# Patient Record
Sex: Male | Born: 2000 | Race: White | Hispanic: No | Marital: Single | Smoking: Never smoker
Health system: Southern US, Community
[De-identification: ages and names within clinical notes are randomized; demographics above are authoritative.]

## PROBLEM LIST (undated history)

## (undated) DIAGNOSIS — F909 Attention-deficit hyperactivity disorder, unspecified type: Secondary | ICD-10-CM

## (undated) HISTORY — PX: NO PAST SURGERIES: SHX2092

## (undated) HISTORY — DX: Attention-deficit hyperactivity disorder, unspecified type: F90.9

---
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
## 2021-04-16 ENCOUNTER — Ambulatory Visit (INDEPENDENT_AMBULATORY_CARE_PROVIDER_SITE_OTHER): Payer: Managed Care, Other (non HMO) | Admitting: Family Medicine

## 2021-04-16 ENCOUNTER — Encounter: Payer: Self-pay | Admitting: Family Medicine

## 2021-04-16 ENCOUNTER — Other Ambulatory Visit: Payer: Self-pay

## 2021-04-16 VITALS — BP 110/70 | HR 77 | Temp 98.4°F | Ht 69.5 in | Wt 154.4 lb

## 2021-04-16 DIAGNOSIS — Z0000 Encounter for general adult medical examination without abnormal findings: Secondary | ICD-10-CM | POA: Diagnosis not present

## 2021-04-16 DIAGNOSIS — Z23 Encounter for immunization: Secondary | ICD-10-CM

## 2021-04-16 DIAGNOSIS — Z111 Encounter for screening for respiratory tuberculosis: Secondary | ICD-10-CM

## 2021-04-16 NOTE — Progress Notes (Signed)
Chief Complaint  Patient presents with   Annual Exam    Well Male Manuel Mcneil is here for a complete physical.  Here w mom.  His last physical was >1 year ago.  Current diet: in general, diet could be better.   Current exercise: none Weight trend: stable Fatigue out of ordinary? No. Seat belt? Yes.    Health maintenance Tetanus- Yes HIV- No Hep C- No  Uncle passed away from cardiac arrest at 43. Had PD and recent implant in brain. +Concussion in 11/21, seems to have resolved.  2+  Past Medical History:  Diagnosis Date   ADHD      Past Surgical History:  Procedure Laterality Date   NO PAST SURGERIES      Medications  Takes no meds routinely.     Allergies Allergies  Allergen Reactions   Penicillins     Family History Family History  Problem Relation Age of Onset   Hypertension Father    Depression Sister    Arthritis Maternal Grandmother    Early death Maternal Grandfather    Arthritis Paternal Grandmother    Cancer Paternal Grandmother        breast cancer    Review of Systems: Constitutional: no fevers or chills Eye:  Poor vision in R eye, L eye WNL Ear/Nose/Mouth/Throat:  Ears:  no hearing loss Nose/Mouth/Throat:  no complaints of nasal congestion, no sore throat Cardiovascular:  no chest pain Respiratory:  no shortness of breath Gastrointestinal:  no abdominal pain, no change in bowel habits GU:  Male: negative for dysuria Musculoskeletal/Extremities:  no pain of the joints Integumentary (Skin/Breast):  no abnormal skin lesions reported Neurologic:  no headaches Endocrine: No unexpected weight changes Hematologic/Lymphatic:  no night sweats  Exam BP 110/70   Pulse 77   Temp 98.4 F (36.9 C) (Oral)   Ht 5' 9.5" (1.765 m)   Wt 154 lb 6 oz (70 kg)   SpO2 97%   BMI 22.47 kg/m  General:  well developed, well nourished, in no apparent distress Skin:  no significant moles, warts, or growths Head:  no masses, lesions, or tenderness Eyes:   pupils equal and round, sclera anicteric without injection Ears:  canals without lesions, TMs shiny without retraction, no obvious effusion, no erythema Nose:  nares patent, septum midline, mucosa normal Throat/Pharynx:  lips and gingiva without lesion; tongue and uvula midline; non-inflamed pharynx; no exudates or postnasal drainage Neck: neck supple without adenopathy, thyromegaly, or masses Lungs:  clear to auscultation, breath sounds equal bilaterally, no respiratory distress Cardio:  regular rate and rhythm, no bruits, no LE edema Abdomen:  abdomen soft, nontender; bowel sounds normal; no masses or organomegaly Genital (male): Deferred Rectal: Deferred Musculoskeletal:  symmetrical muscle groups noted without atrophy or deformity Extremities:  no clubbing, cyanosis, or edema, no deformities, no skin discoloration Neuro:  gait normal; deep tendon reflexes normal and symmetric Psych: well oriented with normal range of affect and appropriate judgment/insight  Assessment and Plan  Well adult exam  Need for meningitis vaccination - Plan: Meningococcal B, OMV  Screening-pulmonary TB - Plan: PPD   Well 20 y.o. male. Counseled on diet and exercise. Self testicular exams recommended at least monthly.  Declines labs.  1st MenB today. 2nd in 1 mo. TST today, reck Fri afternoon.  Declined HPV vaccine today.  Flu shot rec'd for Oct.  Other orders as above. Follow up in 1 year pending the above workup. The patient voiced understanding and agreement to the plan.  Jilda Roche [place], DO 04/16/21 4:06 PM

## 2021-04-16 NOTE — Patient Instructions (Signed)
Keep the diet clean and stay active.  Do monthly self testicular checks in the shower. You are feeling for lumps/bumps that don't belong. If you feel anything like this, let me know!  I recommend getting the flu shot in mid October. This suggestion would change if the CDC comes out with a different recommendation.   Let us know if you need anything.

## 2021-04-18 LAB — TB SKIN TEST
Induration: 0 mm
TB Skin Test: NEGATIVE

## 2022-11-27 ENCOUNTER — Other Ambulatory Visit: Payer: PRIVATE HEALTH INSURANCE

## 2022-11-27 ENCOUNTER — Other Ambulatory Visit: Payer: Self-pay | Admitting: Family Medicine

## 2022-11-27 DIAGNOSIS — K429 Umbilical hernia without obstruction or gangrene: Secondary | ICD-10-CM

## 2022-12-25 ENCOUNTER — Other Ambulatory Visit: Payer: Managed Care, Other (non HMO)

## 2023-01-20 ENCOUNTER — Ambulatory Visit (INDEPENDENT_AMBULATORY_CARE_PROVIDER_SITE_OTHER): Payer: No Typology Code available for payment source | Admitting: Family Medicine

## 2023-01-20 ENCOUNTER — Encounter: Payer: Self-pay | Admitting: Family Medicine

## 2023-01-20 VITALS — BP 111/69 | HR 68 | Temp 98.0°F | Ht 70.0 in | Wt 165.2 lb

## 2023-01-20 DIAGNOSIS — R109 Unspecified abdominal pain: Secondary | ICD-10-CM | POA: Diagnosis not present

## 2023-01-20 DIAGNOSIS — M79605 Pain in left leg: Secondary | ICD-10-CM

## 2023-01-20 DIAGNOSIS — M79604 Pain in right leg: Secondary | ICD-10-CM | POA: Diagnosis not present

## 2023-01-20 NOTE — Progress Notes (Signed)
Musculoskeletal Exam  Patient: Manuel Mcneil DOB: 14-Jan-2001  DOS: 01/20/2023  SUBJECTIVE:  Chief Complaint:   Chief Complaint  Patient presents with   sports injury    Manuel Mcneil is a 22 y.o.  male for evaluation and treatment of abd pain.  He is here with his mother.  Onset:  2 months ago.  He was struck in the stomach with a lacrosse stick. Location: Central abdominal region between the rectus muscles. Character:  aching  Progression of issue:  has improved Associated symptoms: bulge when he bears down and pain w exercise.  Treatment: to date has been rest and ice.   Neurovascular symptoms: no  Past Medical History:  Diagnosis Date   ADHD     Objective: VITAL SIGNS: BP 111/69 (BP Location: Left Arm, Patient Position: Sitting, Cuff Size: Normal)   Pulse 68   Temp 98 F (36.7 C) (Oral)   Ht 5\' 10"  (1.778 m)   Wt 165 lb 4 oz (75 kg)   SpO2 97%   BMI 23.71 kg/m  Constitutional: Well formed, well developed. No acute distress. Thorax & Lungs: No accessory muscle use Musculoskeletal: Abd wall pain, lower extremities.   Normal active range of motion: yes.   Normal passive range of motion: yes Tenderness to palpation: Yes over the linea alba in addition to the tibialis anterior bilaterally, calves bilaterally, and lateral compartment bilaterally Deformity: no Ecchymosis: no Tests positive: None Tests negative: Squeeze test There is no swelling. Neurologic: Normal sensory function. No focal deficits noted.  Psychiatric: Normal mood. Age appropriate judgment and insight. Alert & oriented x 3.    Assessment:  Pain in both lower extremities  Abdominal wall pain - Plan: Ambulatory referral to Sports Medicine  Plan: Vitamin D, stretches/exercises, heat, ice, Tylenol.  PT if no better. Refer to sports medicine.  Suspect linea alba injury.  May need physical therapy as well. F/u as originally scheduled. The patient and his mother voiced understanding and agreement  to the plan.   Jilda Roche [place], DO 01/20/23  12:25 PM

## 2023-01-20 NOTE — Patient Instructions (Addendum)
Heat (pad or rice pillow in microwave) over affected area, 10-15 minutes twice daily.   Ice/cold pack over area for 10-15 min twice daily.  OK to take Tylenol 1000 mg (2 extra strength tabs) or 975 mg (3 regular strength tabs) every 6 hours as needed.  Use a tennis ball over the outside and front of your legs a few times a day.   OK to use Debrox (peroxide) in the ear to loosen up wax. Also recommend using a bulb syringe (for removing boogers from baby's noses) to flush through warm water and vinegar (3-4:1 ratio). An alternative, though more expensive, is an elephant ear washer wax removal kit. Do not use Q-tips as this can impact wax further.  Let us know if you need anything.  Stretching and range of motion exercises These exercises warm up your muscles and joints and improve the movement and flexibility of your lower leg. These exercises also help to relieve pain and stiffness.  Exercise A: Gastrocnemius stretch Sit with your left / right leg extended. Loop a belt or towel around the ball of your left / right foot. The ball of your foot is on the walking surface, right under your toes. Hold both ends of the belt or towel. Keep your left / right ankle and foot relaxed and keep your knee straight while you use the belt or towel to pull your foot and ankle toward you. Stop at the first point of resistance. Hold this position for 30 seconds. Repeat 2 times. Complete this exercise 3 times per week.  Exercise B: Ankle alphabet Sit with your left / right leg supported at the lower leg. Do not rest your foot on anything. Make sure your foot has room to move freely. Think of your left / right foot as a paintbrush, and move your foot to trace each letter of the alphabet in the air. Keep your hip and knee still while you trace. Trace every letter from A to Z. Repeat 2 times. Complete this exercise 3 times per week.  Strengthening exercises These exercises build strength and endurance in your  lower leg. Endurance is the ability to use your muscles for a long time, even after they get tired.  Exercise C: Plantar flexors with band Sit with your left / right leg extended. Loop a rubber exercise band or tube around the ball of your left / right foot. The ball of your foot is on the walking surface, right under your toes. While holding both ends of the band or tube, slowly point your toes downward, pushing them away from you. Hold this position for 3 seconds. Slowly return your foot to the starting position and repeat for a total of 10 repetitions. Repeat 2 times. Complete this exercise 3 times per week.  Exercise D: Plantar flexors, standing Stand with your feet shoulder-width apart. Place your hands on a wall or table to steady yourself as needed, but try not to use it very much for support. Rise up on your toes. If this exercise is too easy, try these options: Shift your weight toward your left / right leg until you feel challenged. If told by your health care provider, stand on your left / right foot only. Hold this position for 3 seconds. Repeat for a total of 10 repetitions. Repeat 2 times. Complete this exercise 3 times per week.  Exercise E: Plantar flexors, eccentric Stand on the balls of your feet on the edge of a step. The ball of your  foot is on the walking surface, right under your toes. Place your hands on a wall or railing for balance as needed, but try not to lean on it for support. Rise up on your toes, using both legs to help. Slowly shift all of your weight to your left / right foot and lift your other foot off the step. Slowly lower your left / right heel so it drops below the level of the step. You will feel a slight stretch in your left / right calf. Put your other foot back onto the step. Repeat 2 times. Complete this exercise 3 times per week. This information is not intended to replace advice given to you by your health care provider. Make sure you discuss  any questions you have with your health care provider.

## 2023-01-26 ENCOUNTER — Ambulatory Visit (INDEPENDENT_AMBULATORY_CARE_PROVIDER_SITE_OTHER): Payer: No Typology Code available for payment source | Admitting: Sports Medicine

## 2023-01-26 VITALS — BP 124/82 | Ht 70.0 in | Wt 165.0 lb

## 2023-01-26 DIAGNOSIS — M722 Plantar fascial fibromatosis: Secondary | ICD-10-CM | POA: Diagnosis not present

## 2023-01-26 DIAGNOSIS — S301XXA Contusion of abdominal wall, initial encounter: Secondary | ICD-10-CM | POA: Diagnosis not present

## 2023-01-26 NOTE — Progress Notes (Signed)
   Subjective:    Patient ID: Traveon Eady, male    DOB: 2000-12-06, 22 y.o.   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  He noticed some bulging around his umbilicus shortly after the injury.  He was evaluated at [facility].  He was able to complete the lacrosse season.  His symptoms are improving but have not resolved.  He still notices that the umbilicus protrudes more than it used to.  Pain is intermittent.  He was also diagnosed with Planter fasciitis during lacrosse season.    Review of Systems As above    Objective:   Physical Exam  Well-developed, fit appearing.  No acute distress  Exam of the abdomen does show some bulging around the umbilicus.  This is not made worse with Valsalva.  There is no palpable reducible hernia.  No ecchymosis.  Bedside ultrasound shows some hypoechoic changes deep to the umbilicus which may suggest a small resolving hematoma in this area.  This is inconclusive as the umbilicus itself does produce some anisotropy.      Assessment & Plan:   Abdominal contusion versus small hematoma  Reassurance that his symptoms should continue to resolve.  I think he may continue with activity using pain as his guide.  I would like to get the records from [facility].  It sounds like he had some imaging done and I would like to review that. I will message him through MyChart once I reviewed those records.  In regards to his Planter fasciitis, I recommended that he return to the office soon with his lacrosse cleats.  We can fit them with either some green sports insoles or some gel cups.  This note was dictated using Dragon naturally speaking software and may contain errors in syntax, spelling, or content which  have not been identified prior to signing this note.

## 2023-01-28 ENCOUNTER — Encounter: Payer: Self-pay | Admitting: Sports Medicine

## 2023-06-08 ENCOUNTER — Encounter: Payer: Self-pay | Admitting: Family Medicine

## 2023-06-08 ENCOUNTER — Ambulatory Visit: Payer: No Typology Code available for payment source | Admitting: Family Medicine

## 2023-06-08 ENCOUNTER — Telehealth: Payer: Self-pay | Admitting: Family Medicine

## 2023-06-08 VITALS — BP 134/71 | HR 72 | Temp 98.1°F | Resp 16 | Wt 162.4 lb

## 2023-06-08 DIAGNOSIS — F411 Generalized anxiety disorder: Secondary | ICD-10-CM

## 2023-06-08 DIAGNOSIS — F410 Panic disorder [episodic paroxysmal anxiety] without agoraphobia: Secondary | ICD-10-CM | POA: Diagnosis not present

## 2023-06-08 MED ORDER — CITALOPRAM HYDROBROMIDE 10 MG PO TABS: 10.0000 mg | ORAL_TABLET | Freq: Every day | ORAL | 3 refills | Status: DC

## 2023-06-08 MED ORDER — CITALOPRAM HYDROBROMIDE 10 MG PO TABS: 10.0000 mg | ORAL_TABLET | Freq: Every day | ORAL | 3 refills | Status: AC

## 2023-06-08 MED ORDER — PROPRANOLOL HCL 10 MG PO TABS: ORAL_TABLET | ORAL | 1 refills | Status: AC

## 2023-06-08 MED ORDER — PROPRANOLOL HCL 10 MG PO TABS: ORAL_TABLET | ORAL | 1 refills | Status: DC

## 2023-06-08 NOTE — Addendum Note (Signed)
Addended by: Scharlene Gloss B on: 06/08/2023 04:45 PM   Modules accepted: Orders

## 2023-06-08 NOTE — Telephone Encounter (Signed)
Victorino Dike (Emergency Contact) called stating pt needed to have their medications transferred to the following pharmacy as [facility] does not accept their insurance:  [facility] [address] [place], [place] [address] P: [phone]

## 2023-06-08 NOTE — Patient Instructions (Signed)
Coping skills Choose 5 that work for you: Take a deep breath Count to 20 Read a book Do a puzzle Meditate Bake Sing Knit Garden Pray Go outside Call a friend Listen to music Take a walk Color Send a note Take a bath Watch a movie Be alone in a quiet place Pet an animal Visit a friend Journal Exercise Stretch   Please consider counseling. Contact [phone] to schedule an appointment or inquire about cost/insurance coverage.  [facility] located at [address], [address], [place], [place].  Phone number = [phone].  Dr. Regan Lemming - Adult Psychiatry.    [facility] located at [address] [place], [place], [place]. Phone number = [phone].   [facility] located at [address], [place], [place].  Phone number = [phone].   [facility] located at [address] [place], [place], [place].  Phone number = [phone].  Stay active.  Let us know if you need anything.

## 2023-06-08 NOTE — Progress Notes (Signed)
Chief Complaint  Patient presents with   Panic Attack    History of panic attacks on and off for years. Daily last week.     Subjective Manuel Mcneil presents for f/u anxiety/panic attacks. Mom present also.   Pt is no currently on any medications.  Has had this off/on for the past several years. Over the past month, has had them more frequently. Having a single panic attack has raised his overall anxiety causing more panic attacks.  No thoughts of harming self or others. No self-medication with alcohol, prescription drugs or illicit drugs. Pt is not following with a counselor/psychologist.  Past Medical History:  Diagnosis Date   ADHD    Allergies as of 06/08/2023       Reactions   Penicillins         Medication List        Accurate as of June 08, 2023 12:22 PM. If you have any questions, ask your nurse or doctor.          citalopram 10 MG tablet Commonly known as: CELEXA Take 1 tablet (10 mg total) by mouth daily. Started by: Sharlene Dory   propranolol 10 MG tablet Commonly known as: INDERAL Take 1 tab 3 times daily as needed for anxiety. Started by: Sharlene Dory        Exam BP 134/71 (BP Location: Right Arm, Patient Position: Sitting, Cuff Size: Small)   Pulse 72   Temp 98.1 F (36.7 C) (Oral)   Resp 16   Wt 162 lb 6.4 oz (73.7 kg)   SpO2 99%   BMI 23.30 kg/m  General:  well developed, well nourished, in no apparent distress Lungs:  No respiratory distress Psych: well oriented with normal range of affect and age-appropriate judgement/insight, alert and oriented x4.  Assessment and Plan  GAD (generalized anxiety disorder) - Plan: citalopram (CELEXA) 10 MG tablet  Panic attack - Plan: citalopram (CELEXA) 10 MG tablet, propranolol (INDERAL) 10 MG tablet  Chronic, uncontrolled. Counseling info provided. Anxiety coping techniques provided. Propranolol 10 mg TID prn anxiety, Celexa 10 mg/d.  F/u in 6 weeks. The patient and  his mom voiced understanding and agreement to the plan.  Jilda Roche [place], DO 06/08/23 12:22 PM

## 2023-06-08 NOTE — Telephone Encounter (Signed)
Sent in prescriptions to updated pharmacy

## 2023-07-20 ENCOUNTER — Ambulatory Visit: Payer: No Typology Code available for payment source | Admitting: Family Medicine

## 2023-10-29 ENCOUNTER — Ambulatory Visit (INDEPENDENT_AMBULATORY_CARE_PROVIDER_SITE_OTHER): Payer: No Typology Code available for payment source | Admitting: Sports Medicine

## 2023-10-29 ENCOUNTER — Other Ambulatory Visit: Payer: Self-pay

## 2023-10-29 VITALS — BP 116/82 | Ht 70.0 in | Wt 165.0 lb

## 2023-10-29 DIAGNOSIS — M25512 Pain in left shoulder: Secondary | ICD-10-CM | POA: Diagnosis not present

## 2023-10-29 MED ORDER — NITROGLYCERIN 0.2 MG/HR TD PT24: MEDICATED_PATCH | TRANSDERMAL | 1 refills | Status: AC

## 2023-10-29 NOTE — Patient Instructions (Addendum)
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  We are starting nitroglycerin patches as below and shoulder rehabilitation exercises to work on both at home and with your athletic trainers.  Let's follow-up in 6-8 weeks to monitor your progress. ---------------------------- Nitroglycerin Protocol Apply 1/4 nitroglycerin patch to affected area daily. Change position of patch within the affected area every 24 hours. You may experience a headache during the first 1-2 weeks of using the patch, these should subside. If you experience headaches after beginning nitroglycerin patch treatment, you may take your preferred over the counter pain reliever. Another side effect of the nitroglycerin patch is skin irritation or rash related to patch adhesive. Please notify our office if you develop more severe headaches or rash, and stop the patch. Tendon healing with nitroglycerin patch may require 12 to 24 weeks depending on the extent of injury. Men should not use if taking Viagra, Cialis, or Levitra.  Do not use if you have migraines or rosacea.

## 2023-10-31 NOTE — Progress Notes (Signed)
   PCP: Sharlene Dory, DO  SUBJECTIVE:   HPI:  Patient is a 23 y.o. male here with chief complaint of left shoulder pain. He is a Building services engineer over at [employer]  Pain in the left shoulder dates back to last year following an in-game injury where he collided with an opposing athlete and was struck in the abdomen causing him to land on his left shoulder. He initial had issues with abdominal pain, but also noticed pain in the left shoulder with activities particularly overhead activities. He denies pain at rest, weakness or radicular pain or paresthesias. He hasn't tried any medications or therapies for this to date.   Pertinent ROS were reviewed with the patient and found to be negative unless otherwise specified above in HPI.   PERTINENT  PMH / PSH / FH / SH:  Past Medical, Surgical, Social, and Family History Reviewed & Updated in the EMR.  Pertinent findings include:  Non-contributory  Allergies  Allergen Reactions   Penicillins     OBJECTIVE:  BP 116/82   Ht 5\' 10"  (1.778 m)   Wt 165 lb (74.8 kg)   BMI 23.68 kg/m   PHYSICAL EXAM:  GEN: Alert and Oriented, NAD, comfortable in exam room RESP: Unlabored respirations, symmetric chest rise PSY: normal mood, congruent affect   Left Shoulder MSK EXAM: No swelling, ecchymoses. No gross deformity. Mild TTP along medial border of left scapula.  FROM, he does have some mild winging of the left scapula and asymmetry with shoulder abduction and chest press. Negative Hawkins, Neers. Negative Yergasons. Strength 5/5 with empty can (+ pain) and resisted internal/external rotation. Negative apprehension. NV intact distally.   Complete MSK [place] of Right Shoulder Patient was seated on exam table and shoulder US examination was performed using high frequency linear probe. Images saved to PACS. -Biceps tendon visualized within the bicipital groove in both SAX and LAX with no significant hypoechoic fluid and intact  tendon fibers without signs of irregularity.  -Subscapularis tendon visualized in both SAX and LAX with intact tendon fibers without signs of irregularity. No significant hypoechoic changes.  -Supraspinatus tendon visualized in both SAX and LAX with intact tendon fibers, though hypoechoic changes noted in distal tendon.  -Infraspinatus and teres minor tendons were visualized in both SAX and LAX with intact tendon fibers without signs of irregularity. No significant hypoechoic changes. -Posterior glenohumeral joint was visualized with proper alignment.  [facility] Joint was visualized without bursal distension or significant bony spurs/arthritic changes.    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 U/S performed and interpreted by Glean Salen, MD. Assessment & Plan Left shoulder pain, unspecified chronicity Multifactorial left shoulder pain with component of scapular dyskinesia and chronic supraspinatus tendinopathy as noted on U/S. Reviewed home rehab exercises and encouraged he start therapy for both of these issues with his ATC at [employer]. Discussed trial of topical nitroglycerin protocol to aid in RTC healing and he is amenable to this - reviewed appropriate use and common side effects such as headaches. F/u in 6 weeks. Patient's questions were answered and they are in agreement with this plan.   Glean Salen, MD PGY-4, Sports Medicine Fellow [facility]
